# Patient Record
Sex: Female | Born: 1975 | Race: White | Hispanic: No | Marital: Married | Smoking: Never smoker
Health system: Southern US, Community
[De-identification: ages and names within clinical notes are randomized; demographics above are authoritative.]

## PROBLEM LIST (undated history)

## (undated) DIAGNOSIS — E162 Hypoglycemia, unspecified: Secondary | ICD-10-CM

## (undated) DIAGNOSIS — Z9889 Other specified postprocedural states: Secondary | ICD-10-CM

## (undated) DIAGNOSIS — R112 Nausea with vomiting, unspecified: Secondary | ICD-10-CM

## (undated) HISTORY — PX: LEFT OOPHORECTOMY: SHX1961

## (undated) HISTORY — PX: OVARIAN CYST REMOVAL: SHX89

## (undated) HISTORY — PX: DILATION AND CURETTAGE OF UTERUS: SHX78

## (undated) HISTORY — PX: KNEE ARTHROSCOPY: SUR90

## (undated) HISTORY — PX: CHOLECYSTECTOMY: SHX55

---

## 2002-05-03 ENCOUNTER — Other Ambulatory Visit: Admission: RE | Admit: 2002-05-03 | Discharge: 2002-05-03 | Payer: Self-pay | Admitting: Gynecology

## 2009-12-23 ENCOUNTER — Emergency Department (HOSPITAL_COMMUNITY): Admission: EM | Admit: 2009-12-23 | Discharge: 2009-12-23 | Payer: Self-pay | Admitting: Emergency Medicine

## 2010-01-30 ENCOUNTER — Ambulatory Visit: Payer: Self-pay | Admitting: Obstetrics & Gynecology

## 2010-01-30 ENCOUNTER — Other Ambulatory Visit: Admission: RE | Admit: 2010-01-30 | Discharge: 2010-01-30 | Payer: Self-pay | Admitting: Obstetrics & Gynecology

## 2010-02-20 ENCOUNTER — Ambulatory Visit: Payer: Self-pay | Admitting: Obstetrics & Gynecology

## 2010-03-06 ENCOUNTER — Ambulatory Visit: Payer: Self-pay | Admitting: Obstetrics & Gynecology

## 2010-07-02 ENCOUNTER — Emergency Department (HOSPITAL_COMMUNITY)
Admission: EM | Admit: 2010-07-02 | Discharge: 2010-07-02 | Payer: Self-pay | Source: Home / Self Care | Admitting: Emergency Medicine

## 2010-10-07 ENCOUNTER — Other Ambulatory Visit: Payer: Self-pay | Admitting: Physician Assistant

## 2010-10-07 ENCOUNTER — Encounter: Payer: Self-pay | Admitting: Physician Assistant

## 2010-10-07 DIAGNOSIS — Z3041 Encounter for surveillance of contraceptive pills: Secondary | ICD-10-CM

## 2010-10-07 LAB — POCT PREGNANCY, URINE: Preg Test, Ur: NEGATIVE

## 2010-10-08 NOTE — Group Therapy Note (Signed)
Vanessa Duffy, DUFORD NO.:  [id number]  MEDICAL RECORD NO.:  [id number]           PATIENT TYPE:  A  LOCATION:  [facility]                   FACILITY:  [facility]  PHYSICIAN:  Tinnie Gens, MD        DATE OF BIRTH:  Jan 20, 1976  DATE OF SERVICE:  10/07/2010                                 CLINIC NOTE  CHIEF COMPLAINT:  Need birth control refill.  HISTORY OF PRESENT ILLNESS:  The patient is a 35 year old gravida 4, para 3-0-1-3 who was previously a patient at the [facility], but she lost her health insurance.  She was scheduled for a LEEP procedure there and lost her insurance 2 weeks prior to the procedure being done, so she has not had that done and they refused to refill her birth control pills until she has that done.  She comes in today, still not having the insurance.  She also notes that she needs this LEEP to be done.  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  The patient understands these risks, she is going to be married sometime soon, but she does have financial application to fill out to get her LEEP done sooner.  PAST MEDICAL HISTORY:  Asthma and anemia.  PAST SURGICAL HISTORY:  Three knee surgeries, cholecystectomy, D and C, 3 surgeries to remove cyst, and 1 ovarian removal.  Left oophorectomy.  MEDICATION:  She is presently on Tri-Previfem one p.o. daily.  ALLERGIES:  ASPIRIN, CODEINE, MORPHINE, and CELEBREX.  OBSTETRICAL HISTORY:  G4, P3 with 3 vaginal deliveries.  GYN HISTORY:  Menarche at age 78.  Cycles are regular, every 29 days, last for approximately 5 days.  She uses pill for birth control.  She has a history of abnormal Pap in September and she needs a LEEP.  She has been told she has endometriosis, ovarian cyst, and Chlamydia in the past.  SOCIAL HISTORY:  She does work at [employer].  She is a Runner, broadcasting/film/video.  She lives with her fiance.  She does not smoke cigarettes. She does drink  alcohol socially 2 per week.  No other drug use.  FAMILY HISTORY:  Significant for diabetes, heart disease, hypertension, and a grandmother with colon cancer.  PHYSICAL EXAMINATION:  GENERAL/VITAL SIGNS:  On physical exam today, vitals signs are as noted in the chart.  She is a well-developed, well- nourished female in no acute stress. ABDOMEN:  Soft, nontender, and nondistended. GU:  Normal external female genitalia.  BUS is normal.  Cervix parous without lesions.  Uterus normal size, shape, anteverted, and mobile. Adnexa without tenderness.  IMPRESSION: 1. Birth control consult, needs a pill. 2. History of abnormal Pap.  Needs LEEP procedure and the patient with     no insurance.  PLAN:  We will refill her birth control for 3 months, which should go through to the end of July.  She has a big trip planned to [place] and she plans on having in June and she will be having her LEEP done after that.  She will follow her paper work and see  what she qualifies for in terms of assistance.  Again 10% risk of cancer was stressed with the patient regarding high-grade SIL with no treatment.  She will be almost a year without treatment.  The patient seems to understand all these risks, and she will come back for her LEEP.          ______________________________ Tinnie Gens, MD    TP/MEDQ  D:  10/07/2010  T:  10/08/2010  Job:  [id number]

## 2010-10-29 NOTE — Assessment & Plan Note (Signed)
[name], Duffy NO.:  [id number]   MEDICAL RECORD NO.:  [id number]          PATIENT TYPE:  POB   LOCATION:  [facility] [place]         FACILITY:  [facility]   PHYSICIAN:  Allie Bossier, MD        DATE OF BIRTH:  January 05, 1976   DATE OF SERVICE:  01/30/2010                                  CLINIC NOTE   [name] is a 35 year old divorced white gravida 4, para 3, abortus 1  who comes in here for a new patient annual exam.  She would like  permanent sterility in the form of a tubal ligation.  She also complains  this month, she has had some breakthrough bleeding even though she has  been an phonetical about taking her birth control pills.   PAST MEDICAL HISTORY:  Endometriosis.   PAST SURGICAL HISTORY:  She has had at least 5 laparoscopies including a  left oophorectomy due to recurrent ruptured ovarian cysts.  She had a  cholecystectomy, laparoscopic, and a D and C.   FAMILY HISTORY:  Negative for breast cancer, but a maternal grandmother  had what sounds like widespread cancer that did involve at some point or  another her colon and a GYN organ as well as her brain and other organs.   SOCIAL HISTORY:  She drinks alcohol socially, but denies tobacco or  illegal drug use.   REVIEW OF SYSTEMS:  She lives with her mother and stepfather, her sister  and a total of 4 children.  She is planning to move in with her  boyfriend in the near future.  Her Pap smear was last done 2 years ago  and is always abnormal, although she denies any cervical surgery.  Previously, she had a Mirena in approximately 2002 and have it removed  after it got infected and she has been monogamous for the last 6 weeks.   MEDICATIONS:  Tri-Previfem birth control pill.   ALLERGIES:  ASPIRIN, CODEINE, MORPHINE, and CELEBREX.  No latex  allergies.  No food allergies.   PHYSICAL EXAMINATION:  GENERAL:  Well-nourished pleasant white female.  VITAL SIGNS:  Height 5 feet 9.5 inches, weight 172  pounds, blood  pressure 113/71, pulse 75.  HEENT:  Normal.  HEART:  Regular rate and rhythm.  LUNGS:  Clear to auscultation bilaterally.  BREASTS:  Normal bilaterally.  ABDOMEN:  Benign.  EXTERNAL GENITALIA:  No lesions.  Cervix parous, no lesions.  Uterus  normal size, shape, and anteverted mobile.  Adnexa nontender.  No  masses.   ASSESSMENT AND PLAN:  1. Annual exam.  I have checked Pap smear.  I recommended self-breast      and self-vulvar exams.  2. Breakthrough bleeding.  I have checked the TSH and cervical      cultures.  3. She desires sterility.  She signed her Medicaid 38 papers today,      and I have turned in the paperwork to schedule an Essure.  At once      I counseled her about the risks of repeat of a yet another      laparoscopy and its risk of damage to the bowel.  She  has decided      to go forth with the Essure.  She understands that she will need to      continue her OCPs until a hysteroscopy shows that the Essure has      completely blocked the tube.  She may in fact stay on her OCPs for      an extended period of time because of her history of endometriosis      and dysmenorrhea.      Allie Bossier, MD     MCD/MEDQ  D:  01/30/2010  T:  01/31/2010  Job:  [id number]

## 2010-11-14 ENCOUNTER — Encounter: Payer: Self-pay | Admitting: Obstetrics and Gynecology

## 2011-02-27 ENCOUNTER — Ambulatory Visit (HOSPITAL_COMMUNITY)
Admission: RE | Admit: 2011-02-27 | Discharge: 2011-02-27 | Disposition: A | Discharge status: Home / Self Care | Payer: Self-pay | Source: Ambulatory Visit | Attending: Internal Medicine | Admitting: Internal Medicine

## 2011-02-27 ENCOUNTER — Other Ambulatory Visit (HOSPITAL_COMMUNITY): Payer: Self-pay | Admitting: Internal Medicine

## 2011-02-27 DIAGNOSIS — W19XXXA Unspecified fall, initial encounter: Secondary | ICD-10-CM

## 2011-02-27 DIAGNOSIS — R51 Headache: Secondary | ICD-10-CM | POA: Insufficient documentation

## 2011-04-14 ENCOUNTER — Emergency Department (HOSPITAL_COMMUNITY): Payer: Self-pay

## 2011-04-14 ENCOUNTER — Emergency Department (HOSPITAL_COMMUNITY)
Admission: EM | Admit: 2011-04-14 | Discharge: 2011-04-14 | Disposition: A | Discharge status: Home / Self Care | Payer: Self-pay | Attending: Emergency Medicine | Admitting: Emergency Medicine

## 2011-04-14 DIAGNOSIS — F411 Generalized anxiety disorder: Secondary | ICD-10-CM | POA: Insufficient documentation

## 2011-04-14 DIAGNOSIS — W261XXA Contact with sword or dagger, initial encounter: Secondary | ICD-10-CM | POA: Insufficient documentation

## 2011-04-14 DIAGNOSIS — S61409A Unspecified open wound of unspecified hand, initial encounter: Secondary | ICD-10-CM | POA: Insufficient documentation

## 2011-04-14 DIAGNOSIS — W260XXA Contact with knife, initial encounter: Secondary | ICD-10-CM | POA: Insufficient documentation

## 2011-04-23 ENCOUNTER — Telehealth: Payer: Self-pay | Admitting: *Deleted

## 2011-04-23 NOTE — Telephone Encounter (Signed)
Left message for pt we have received a refill request from [facility] [phone] for her birth control. We last called in a refill to [facility]- just wanted to confirm the correct pharmacy. Also, she needs an appt for LEEP procedure- need to know if she saw the LEEP video @ last appt in April. Please call back to verify correct pharmacy and to scheduled follow up appt- either LEEP eval or LEEP procedure if video already viewed. Dr. Shawnie Pons said pt may have enough refill of Tri-sprintec until next appt.

## 2011-04-28 MED ORDER — NORGESTIM-ETH ESTRAD TRIPHASIC 0.18/0.215/0.25 MG-25 MCG PO TABS: 1.0000 | ORAL_TABLET | Freq: Every day | ORAL | Status: DC

## 2011-04-28 NOTE — Telephone Encounter (Signed)
Spoke w/pt today- confirmed that she has changed pharmacies to [facility] refill sent in. I also discussed that she needs the LEEP procedure. She acknowledged the info-she states that she is getting married and will have insurance effective in January. I instructed pt to call back for LEEP eval appt in Jan. or early Feb.  Pt voiced understanding.

## 2011-05-06 ENCOUNTER — Telehealth: Payer: Self-pay | Admitting: *Deleted

## 2011-05-06 MED ORDER — NORGESTIM-ETH ESTRAD TRIPHASIC 0.18/0.215/0.25 MG-35 MCG PO TABS: 1.0000 | ORAL_TABLET | Freq: Every day | ORAL | Status: DC

## 2011-05-06 NOTE — Telephone Encounter (Addendum)
Message left by pharmacist that she needed clarification of an e-prescribed order.  I called back this morning and spoke with pharmacist- Thayer Ohm. He stated that the pt wanted the generic equivalent of ortho Tricyclen and what was ordered was the Ortho Tricyclen Lo for which there is no generic. I authorized the change to TriSprintec 1 pack and 3 refills.

## 2011-07-22 ENCOUNTER — Encounter: Payer: Self-pay | Admitting: Obstetrics & Gynecology

## 2011-07-22 ENCOUNTER — Ambulatory Visit (INDEPENDENT_AMBULATORY_CARE_PROVIDER_SITE_OTHER): Admitting: Obstetrics & Gynecology

## 2011-07-22 ENCOUNTER — Other Ambulatory Visit: Payer: Self-pay | Admitting: Obstetrics & Gynecology

## 2011-07-22 DIAGNOSIS — Z1272 Encounter for screening for malignant neoplasm of vagina: Secondary | ICD-10-CM

## 2011-07-22 DIAGNOSIS — Z0000 Encounter for general adult medical examination without abnormal findings: Secondary | ICD-10-CM

## 2011-07-22 DIAGNOSIS — Z113 Encounter for screening for infections with a predominantly sexual mode of transmission: Secondary | ICD-10-CM

## 2011-07-22 DIAGNOSIS — N938 Other specified abnormal uterine and vaginal bleeding: Secondary | ICD-10-CM

## 2011-07-22 DIAGNOSIS — N870 Mild cervical dysplasia: Secondary | ICD-10-CM

## 2011-07-22 DIAGNOSIS — N939 Abnormal uterine and vaginal bleeding, unspecified: Secondary | ICD-10-CM
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
## 2011-07-23 LAB — TSH: TSH: 0.979 u[IU]/mL (ref 0.350–4.500)

## 2011-07-24 ENCOUNTER — Ambulatory Visit (HOSPITAL_COMMUNITY)
Admission: RE | Admit: 2011-07-24 | Discharge: 2011-07-24 | Disposition: A | Discharge status: Home / Self Care | Source: Ambulatory Visit | Attending: Obstetrics & Gynecology | Admitting: Obstetrics & Gynecology

## 2011-07-24 DIAGNOSIS — N949 Unspecified condition associated with female genital organs and menstrual cycle: Secondary | ICD-10-CM | POA: Insufficient documentation

## 2011-07-24 DIAGNOSIS — N938 Other specified abnormal uterine and vaginal bleeding: Secondary | ICD-10-CM | POA: Insufficient documentation

## 2011-09-02 ENCOUNTER — Ambulatory Visit: Admit: 2011-09-02 | Payer: Self-pay | Admitting: Obstetrics & Gynecology

## 2011-09-02 SURGERY — LIGATION, FALLOPIAN TUBE, LAPAROSCOPIC
Anesthesia: General | Laterality: Bilateral

## 2011-09-17 ENCOUNTER — Encounter (HOSPITAL_COMMUNITY): Payer: Self-pay | Admitting: Pharmacist

## 2011-09-29 ENCOUNTER — Inpatient Hospital Stay (HOSPITAL_COMMUNITY): Admission: RE | Admit: 2011-09-29 | Source: Ambulatory Visit

## 2011-10-01 ENCOUNTER — Encounter (HOSPITAL_COMMUNITY): Payer: Self-pay

## 2011-10-01 ENCOUNTER — Encounter (HOSPITAL_COMMUNITY)
Admission: RE | Admit: 2011-10-01 | Discharge: 2011-10-01 | Disposition: A | Discharge status: Home / Self Care | Source: Ambulatory Visit | Attending: Obstetrics & Gynecology | Admitting: Obstetrics & Gynecology

## 2011-10-01 HISTORY — DX: Other specified postprocedural states: R11.2

## 2011-10-01 HISTORY — DX: Hypoglycemia, unspecified: E16.2

## 2011-10-01 HISTORY — DX: Other specified postprocedural states: Z98.890

## 2011-10-01 LAB — CBC
HCT: 36.9 % (ref 36.0–46.0)
RBC: 4.04 MIL/uL (ref 3.87–5.11)
RDW: 12.4 % (ref 11.5–15.5)
WBC: 5.6 10*3/uL (ref 4.0–10.5)

## 2011-10-01 LAB — SURGICAL PCR SCREEN: Staphylococcus aureus: POSITIVE — AB

## 2011-10-01 NOTE — Patient Instructions (Addendum)
20 ROSANNE W[address]LFAR[address]  10/01/2011   Your procedure is scheduled on:  10/06/11  Enter through the Main Entrance of [facility] at 3:00 PM  Pick up the phone at the desk and dial [phone].   Call this number if you have problems the morning of surgery: [phone]   Remember:   Do not eat food:After Midnight.  Do not drink clear liquids: 4 Hours before arrival.  Take these medicines the morning of surgery with A SIP OF WATER: NA   Do not wear jewelry, make-up or nail polish.  Do not wear lotions, powders, or perfumes. You may wear deodorant.  Do not shave 48 hours prior to surgery.  Do not bring valuables to the hospital.  Contacts, dentures or bridgework may not be worn into surgery.  Leave suitcase in the car. After surgery it may be brought to your room.  For patients admitted to the hospital, checkout time is 11:00 AM the day of discharge.   Patients discharged the day of surgery will not be allowed to drive home.  Name and phone number of your driver: NA  Special Instructions: CHG Shower Use Special Wash: 1/2 bottle night before surgery and 1/2 bottle morning of surgery.   Please read over the following fact sheets that you were given: MRSA Information

## 2011-10-06 ENCOUNTER — Encounter (HOSPITAL_COMMUNITY): Payer: Self-pay | Admitting: Anesthesiology

## 2011-10-06 ENCOUNTER — Ambulatory Visit (HOSPITAL_COMMUNITY): Admitting: Anesthesiology

## 2011-10-06 ENCOUNTER — Encounter (HOSPITAL_COMMUNITY): Payer: Self-pay | Admitting: *Deleted

## 2011-10-06 ENCOUNTER — Encounter (HOSPITAL_COMMUNITY)
Admission: RE | Disposition: A | Discharge status: Home / Self Care | Payer: Self-pay | Source: Ambulatory Visit | Attending: Obstetrics & Gynecology

## 2011-10-06 ENCOUNTER — Ambulatory Visit (HOSPITAL_COMMUNITY)
Admission: RE | Admit: 2011-10-06 | Discharge: 2011-10-06 | Disposition: A | Discharge status: Home / Self Care | Source: Ambulatory Visit | Attending: Obstetrics & Gynecology | Admitting: Obstetrics & Gynecology

## 2011-10-06 DIAGNOSIS — Z302 Encounter for sterilization: Secondary | ICD-10-CM | POA: Insufficient documentation

## 2011-10-06 DIAGNOSIS — N83209 Unspecified ovarian cyst, unspecified side: Secondary | ICD-10-CM | POA: Insufficient documentation

## 2011-10-06 HISTORY — PX: LAPAROSCOPIC TUBAL LIGATION: SHX1937

## 2011-10-06 SURGERY — LIGATION, FALLOPIAN TUBE, LAPAROSCOPIC
Anesthesia: General | Site: Abdomen | Laterality: Bilateral | Wound class: Clean

## 2011-10-06 MED ORDER — HYDROCODONE-ACETAMINOPHEN 10-500 MG PO TABS: 1.0000 | ORAL_TABLET | Freq: Four times a day (QID) | ORAL | Status: AC | PRN

## 2011-10-06 MED ORDER — PROPOFOL 10 MG/ML IV EMUL
INTRAVENOUS | Status: AC
  Filled 2011-10-06: qty 20

## 2011-10-06 MED ORDER — FENTANYL CITRATE 0.05 MG/ML IJ SOLN
INTRAMUSCULAR | Status: AC
  Filled 2011-10-06: qty 2

## 2011-10-06 MED ORDER — FENTANYL CITRATE 0.05 MG/ML IJ SOLN
INTRAMUSCULAR | Status: DC | PRN
  Administered 2011-10-06: 100 ug via INTRAVENOUS
  Administered 2011-10-06: 150 ug via INTRAVENOUS

## 2011-10-06 MED ORDER — BUPIVACAINE HCL (PF) 0.5 % IJ SOLN
INTRAMUSCULAR | Status: DC | PRN
  Administered 2011-10-06: 6 mL

## 2011-10-06 MED ORDER — SCOPOLAMINE 1 MG/3DAYS TD PT72
MEDICATED_PATCH | TRANSDERMAL | Status: AC
  Administered 2011-10-06: 1.5 mg via TRANSDERMAL
  Filled 2011-10-06: qty 1

## 2011-10-06 MED ORDER — DEXTROSE IN LACTATED RINGERS 5 % IV SOLN: INTRAVENOUS | Status: DC

## 2011-10-06 MED ORDER — ROCURONIUM BROMIDE 100 MG/10ML IV SOLN
INTRAVENOUS | Status: DC | PRN
  Administered 2011-10-06: 30 mg via INTRAVENOUS

## 2011-10-06 MED ORDER — DEXAMETHASONE SODIUM PHOSPHATE 10 MG/ML IJ SOLN
INTRAMUSCULAR | Status: AC
  Filled 2011-10-06: qty 1

## 2011-10-06 MED ORDER — CEFAZOLIN SODIUM 1-5 GM-% IV SOLN
INTRAVENOUS | Status: AC
  Filled 2011-10-06: qty 50

## 2011-10-06 MED ORDER — PROPOFOL 10 MG/ML IV EMUL
INTRAVENOUS | Status: DC | PRN
  Administered 2011-10-06: 200 mg via INTRAVENOUS

## 2011-10-06 MED ORDER — GLYCOPYRROLATE 0.2 MG/ML IJ SOLN
INTRAMUSCULAR | Status: DC | PRN
  Administered 2011-10-06: 0.4 mg via INTRAVENOUS

## 2011-10-06 MED ORDER — NEOSTIGMINE METHYLSULFATE 1 MG/ML IJ SOLN
INTRAMUSCULAR | Status: AC
  Filled 2011-10-06: qty 10

## 2011-10-06 MED ORDER — MEPERIDINE HCL 25 MG/ML IJ SOLN: 6.2500 mg | INTRAMUSCULAR | Status: DC | PRN

## 2011-10-06 MED ORDER — SCOPOLAMINE 1 MG/3DAYS TD PT72
1.0000 | MEDICATED_PATCH | Freq: Once | TRANSDERMAL | Status: DC
  Administered 2011-10-06: 1.5 mg via TRANSDERMAL

## 2011-10-06 MED ORDER — FENTANYL CITRATE 0.05 MG/ML IJ SOLN
25.0000 ug | INTRAMUSCULAR | Status: DC | PRN
  Administered 2011-10-06 (×2): 50 ug via INTRAVENOUS

## 2011-10-06 MED ORDER — LIDOCAINE HCL (CARDIAC) 20 MG/ML IV SOLN
INTRAVENOUS | Status: AC
  Filled 2011-10-06: qty 5

## 2011-10-06 MED ORDER — PROMETHAZINE HCL 25 MG/ML IJ SOLN
INTRAMUSCULAR | Status: AC
  Filled 2011-10-06: qty 1

## 2011-10-06 MED ORDER — MIDAZOLAM HCL 2 MG/2ML IJ SOLN
INTRAMUSCULAR | Status: AC
  Filled 2011-10-06: qty 2

## 2011-10-06 MED ORDER — HYDROCODONE-ACETAMINOPHEN 5-325 MG PO TABS
ORAL_TABLET | ORAL | Status: AC
  Filled 2011-10-06: qty 1

## 2011-10-06 MED ORDER — HYDROCODONE-ACETAMINOPHEN 5-325 MG PO TABS: 1.0000 | ORAL_TABLET | ORAL | Status: DC | PRN

## 2011-10-06 MED ORDER — LACTATED RINGERS IV SOLN
INTRAVENOUS | Status: DC
  Administered 2011-10-06: 16:00:00 via INTRAVENOUS

## 2011-10-06 MED ORDER — LIDOCAINE HCL (CARDIAC) 20 MG/ML IV SOLN
INTRAVENOUS | Status: DC | PRN
  Administered 2011-10-06: 50 mg via INTRAVENOUS

## 2011-10-06 MED ORDER — FENTANYL CITRATE 0.05 MG/ML IJ SOLN
INTRAMUSCULAR | Status: AC
  Filled 2011-10-06: qty 5

## 2011-10-06 MED ORDER — ROCURONIUM BROMIDE 50 MG/5ML IV SOLN
INTRAVENOUS | Status: AC
  Filled 2011-10-06: qty 1

## 2011-10-06 MED ORDER — PROMETHAZINE HCL 25 MG/ML IJ SOLN
6.2500 mg | INTRAMUSCULAR | Status: DC | PRN
  Administered 2011-10-06: 6.25 mg via INTRAVENOUS

## 2011-10-06 MED ORDER — ONDANSETRON HCL 4 MG/2ML IJ SOLN
INTRAMUSCULAR | Status: AC
  Filled 2011-10-06: qty 2

## 2011-10-06 MED ORDER — ONDANSETRON HCL 4 MG/2ML IJ SOLN
INTRAMUSCULAR | Status: DC | PRN
  Administered 2011-10-06: 4 mg via INTRAVENOUS

## 2011-10-06 MED ORDER — NEOSTIGMINE METHYLSULFATE 1 MG/ML IJ SOLN
INTRAMUSCULAR | Status: DC | PRN
  Administered 2011-10-06: 2.5 mg via INTRAVENOUS

## 2011-10-06 MED ORDER — DEXAMETHASONE SODIUM PHOSPHATE 4 MG/ML IJ SOLN
INTRAMUSCULAR | Status: DC | PRN
  Administered 2011-10-06: 10 mg via INTRAVENOUS

## 2011-10-06 MED ORDER — MIDAZOLAM HCL 5 MG/5ML IJ SOLN
INTRAMUSCULAR | Status: DC | PRN
  Administered 2011-10-06: 2 mg via INTRAVENOUS

## 2011-10-06 MED ORDER — CEFAZOLIN SODIUM 1-5 GM-% IV SOLN
1.0000 g | INTRAVENOUS | Status: AC
  Administered 2011-10-06: 1 g via INTRAVENOUS

## 2011-10-06 MED ORDER — BUPIVACAINE HCL (PF) 0.5 % IJ SOLN
INTRAMUSCULAR | Status: AC
  Filled 2011-10-06: qty 30

## 2011-10-06 SURGICAL SUPPLY — 20 items
CABLE HIGH FREQUENCY MONO STRZ (ELECTRODE) IMPLANT
CATH ROBINSON RED A/P 16FR (CATHETERS) ×2 IMPLANT
CHLORAPREP W/TINT 26ML (MISCELLANEOUS) ×2 IMPLANT
CLIP FILSHIE TUBAL LIGA STRL (Clip) ×2 IMPLANT
CLOTH BEACON ORANGE TIMEOUT ST (SAFETY) ×2 IMPLANT
DRSG COVADERM PLUS 2X2 (GAUZE/BANDAGES/DRESSINGS) ×2 IMPLANT
ELECT REM PT RETURN 9FT ADLT (ELECTROSURGICAL) ×2
ELECTRODE REM PT RTRN 9FT ADLT (ELECTROSURGICAL) ×1 IMPLANT
GLOVE BIO SURGEON STRL SZ 6.5 (GLOVE) ×4 IMPLANT
GOWN PREVENTION PLUS LG XLONG (DISPOSABLE) ×4 IMPLANT
NDL SAFETY ECLIPSE 18X1.5 (NEEDLE) ×1 IMPLANT
NEEDLE HYPO 18GX1.5 SHARP (NEEDLE) ×1
NEEDLE INSUFFLATION 14GA 120MM (NEEDLE) ×2 IMPLANT
PACK LAPAROSCOPY BASIN (CUSTOM PROCEDURE TRAY) ×2 IMPLANT
SUT VICRYL 0 UR6 27IN ABS (SUTURE) ×2 IMPLANT
SUT VICRYL 4-0 PS2 18IN ABS (SUTURE) ×2 IMPLANT
TOWEL OR 17X24 6PK STRL BLUE (TOWEL DISPOSABLE) ×4 IMPLANT
TROCAR XCEL NON-BLD 11X100MML (ENDOMECHANICALS) IMPLANT
TROCAR XCEL NON-BLD 5MMX100MML (ENDOMECHANICALS) IMPLANT
WATER STERILE IRR 1000ML POUR (IV SOLUTION) IMPLANT

## 2011-10-06 NOTE — Op Note (Signed)
10/06/2011  4:43 PM  PATIENT:  Vanessa Duffy  36 y.o. female  PRE-OPERATIVE DIAGNOSIS:  Desires sterilization  POST-OPERATIVE DIAGNOSIS:  Desires sterilization  PROCEDURE:  Procedure(s) (LRB): LAPAROSCOPIC TUBAL LIGATION DRAINAGE OF RIGHT OVARIAN CYST  SURGEON:  Surgeon(s) and Role:    * Allie Bossier, MD - Primary  PHYSICIAN ASSISTANT:   ASSISTANTS: none   ANESTHESIA:   general  EBL:  Total I/O In: -  Out: 50 [Urine:50]  BLOOD ADMINISTERED:none  DRAINS: none   LOCAL MEDICATIONS USED:  MARCAINE     SPECIMEN:  No Specimen  DISPOSITION OF SPECIMEN:  N/A  COUNTS:  YES  TOURNIQUET:  * No tourniquets in log *  DICTATION: .Dragon Dictation  PLAN OF CARE: Discharge to home after PACU  PATIENT DISPOSITION:  PACU - hemodynamically stable.   Delay start of Pharmacological VTE agent (>24hrs) due to surgical blood loss or risk of bleeding: not applicable  The risks, benefits, and alternatives of surgery were explained, understood, accepted. Consents were signed. All questions were answered. She was taken to the operating room and general anesthesia was applied without complication. She was placed in dorsal lithotomy position. Her abdomen and vagina were prepped and draped in usual sterile fashion a timeout procedure was done a bimanual exam revealed a normal size and shape, anteverted, mobile uterus and nonenlarged adnexa. Gloves were changed and attention was turned to the abdomen after a Hulka manipulator was placed. 0.5% Marcaine was injected at the umbilicus there site of her previous incision (were she asked me to make this incision). A transverse incision was made and a varies needle was placed intraperitoneally. Low-flow CO2 was used to insufflate the abdomen to approximately 4 L. After good pneumoperitoneum was established, a 12 mm operative scope was placed. Laparoscopy confirmed correct placement. She was placed in Trendelenburg position and the pelvis was inspected. There  was about 20 cc of clear fluid in the cul-de-sac. A right ovarian cyst was noted as well as complete absence of her left ovary and tube. I placed a Filshie clip across the entire her right oviduct in the isthmic region. I then used a laparoscopic needle to drain the right ovarian cyst. I then used the laparoscopic suction to remove the cul-de-sac fluid. The CO2 was allowed to escape from the abdomen after hemostasis was noted at the right ovarian cystotomy site. I closed the umbilical fascia with a 0 Vicryl figure-of-eight suture. I did a subcuticular closure with 4-0 Vicryl suture at the skin. I removed the tenaculum. She was extubated and taken to recovery room in stable condition. The instrument, sponge, and needle counts were correct.

## 2011-10-06 NOTE — Anesthesia Postprocedure Evaluation (Signed)
Anesthesia Post Note  Patient: Vanessa Duffy  Procedure(s) Performed: Procedure(s) (LRB): LAPAROSCOPIC TUBAL LIGATION (Bilateral)  Anesthesia type: General  Patient location: PACU  Post pain: Pain level controlled  Post assessment: Post-op Vital signs reviewed  Last Vitals:  Filed Vitals:   10/06/11 1800  BP: 113/68  Pulse: 53  Temp:   Resp: 16    Post vital signs: Reviewed  Level of consciousness: sedated  Complications: No apparent anesthesia complicationsfj

## 2011-10-06 NOTE — Anesthesia Preprocedure Evaluation (Signed)
Anesthesia Evaluation  Patient identified by MRN, date of birth, ID band Patient awake    Reviewed: Allergy & Precautions, H&P , NPO status , Patient's Chart, lab work & pertinent test results, reviewed documented beta blocker date and time   Airway Mallampati: I TM Distance: >3 FB Neck ROM: full    Dental No notable dental hx. (+) Teeth Intact   Pulmonary asthma ,    Pulmonary exam normal       Cardiovascular negative cardio ROS      Neuro/Psych negative neurological ROS  negative psych ROS   GI/Hepatic negative GI ROS, Neg liver ROS,   Endo/Other  negative endocrine ROS  Renal/GU negative Renal ROS  negative genitourinary   Musculoskeletal negative musculoskeletal ROS (+)   Abdominal Normal abdominal exam  (+)   Peds negative pediatric ROS (+)  Hematology negative hematology ROS (+)   Anesthesia Other Findings   Reproductive/Obstetrics negative OB ROS                           Anesthesia Physical Anesthesia Plan  ASA: II  Anesthesia Plan: General   Post-op Pain Management:    Induction: Intravenous  Airway Management Planned: Oral ETT  Additional Equipment:   Intra-op Plan:   Post-operative Plan: Extubation in OR  Informed Consent: I have reviewed the patients History and Physical, chart, labs and discussed the procedure including the risks, benefits and alternatives for the proposed anesthesia with the patient or authorized representative who has indicated his/her understanding and acceptance.   Dental Advisory Given  Plan Discussed with: CRNA and Surgeon  Anesthesia Plan Comments:         Anesthesia Quick Evaluation

## 2011-10-06 NOTE — Transfer of Care (Signed)
Immediate Anesthesia Transfer of Care Note  Patient: Vanessa Duffy  Procedure(s) Performed: Procedure(s) (LRB): LAPAROSCOPIC TUBAL LIGATION (Bilateral)  Patient Location: PACU  Anesthesia Type: General  Level of Consciousness: awake, alert  and oriented  Airway & Oxygen Therapy: Patient Spontanous Breathing and Patient connected to nasal cannula oxygen  Post-op Assessment: Report given to PACU RN and Post -op Vital signs reviewed and stable  Post vital signs: Reviewed and stable  Complications: No apparent anesthesia complications

## 2011-10-06 NOTE — Anesthesia Procedure Notes (Signed)
Procedure Name: Intubation Date/Time: 10/06/2011 4:15 PM Performed by: Shanon Payor Pre-anesthesia Checklist: Patient identified, Emergency Drugs available, Suction available, Timeout performed and Patient being monitored Patient Re-evaluated:Patient Re-evaluated prior to inductionOxygen Delivery Method: Circle system utilized Preoxygenation: Pre-oxygenation with 100% oxygen Intubation Type: IV induction and Cricoid Pressure applied Ventilation: Mask ventilation without difficulty Laryngoscope Size: Miller, 2, Mac and 3 Grade View: Grade IV Tube type: Oral Tube size: 7.0 mm Number of attempts: 3 Airway Equipment and Method: Stylet Placement Confirmation: positive ETCO2,  ETT inserted through vocal cords under direct vision,  CO2 detector and breath sounds checked- equal and bilateral Secured at: 22 cm Tube secured with: Tape Dental Injury: Teeth and Oropharynx as per pre-operative assessment  Difficulty Due To: Difficulty was unanticipated and Difficult Airway- due to anterior larynx

## 2011-10-06 NOTE — H&P (Signed)
    36 yo MW [id number] who is here today because she wants to have permanent sterility. She is currently on OCPs.  PMH- concusion  PSH- left ooperectomy, GB, knee surgery BL, laparoscopy times 2   All- asa, celebrex, codeine  SH-no tobacco, rare etoh  FH- no breast, gyn cancer, colon cancer in her GM  ROS- married for 4 months, recently unemployed  PE  [facility] NAD  Heart-rrr Lungs-ctab Abd-benign  A/P-multiparity, desires sterility. Risks and failure rate discussed.

## 2011-10-06 NOTE — Anesthesia Postprocedure Evaluation (Signed)
  Anesthesia Post-op Note  Patient: Vanessa Duffy  Procedure(s) Performed: Procedure(s) (LRB): LAPAROSCOPIC TUBAL LIGATION (Bilateral)  Patient Location: PACU  Anesthesia Type: General  Level of Consciousness: awake, alert  and oriented  Airway and Oxygen Therapy: Patient Spontanous Breathing and Patient connected to nasal cannula oxygen  Post-op Pain: none  Post-op Assessment: Post-op Vital signs reviewed and Patient's Cardiovascular Status Stable  Post-op Vital Signs: Reviewed and stable  Complications: No apparent anesthesia complications

## 2011-10-06 NOTE — Discharge Instructions (Signed)
Diagnostic Laparoscopy Laparoscopy is a surgical procedure. It is used to diagnose and treat diseases inside the belly(abdomen). It is usually a brief, common, and relatively simple procedure. The laparoscopeis a thin, lighted, pencil-sized instrument. It is like a telescope. It is inserted into your abdomen through a small cut (incision). Your caregiver can look at the organs inside your body through this instrument. He or she can see if there is anything abnormal. Laparoscopy can be done either in a hospital or outpatient clinic. You may be given a mild sedative to help you relax before the procedure. Once in the operating room, you will be given a drug to make you sleep (general anesthesia). Laparoscopy usually lasts less than 1 hour. After the procedure, you will be monitored in a recovery area until you are stable and doing well. Once you are home, it will take 2 to 3 days to fully recover. RISKS AND COMPLICATIONS  Laparoscopy has relatively few risks. Your caregiver will discuss the risks with you before the procedure. Some problems that can occur include:  Infection.   Bleeding.   Damage to other organs.   Anesthetic side effects.  PROCEDURE Once you receive anesthesia, your surgeon inflates the abdomen with a harmless gas (carbon dioxide). This makes the organs easier to see. The laparoscope is inserted into the abdomen through a small incision. This allows your surgeon to see into the abdomen. Other small instruments are also inserted into the abdomen through other small openings. Many surgeons attach a video camera to the laparoscope to enlarge the view. During a diagnostic laparoscopy, the surgeon may be looking for inflammation, infection, or cancer. Your surgeon may take tissue samples(biopsies). The samples are sent to a specialist in looking at cells and tissue samples (pathologist). The pathologist examines them under a microscope. Biopsies can help to diagnose or confirm a  disease. AFTER THE PROCEDURE   The gas is released from inside the abdomen.   The incisions are closed with stitches (sutures). Because these incisions are small (usually less than 1/2 inch), there is usually minimal discomfort after the procedure. There may be some mild discomfort in the throat. This is from the tube placed in the throat while you were sleeping. You may have some mild abdominal discomfort. There may also be discomfort from the instrument placement incisions in the abdomen.   The recovery time is shortened as long as there are no complications.   You will rest in a recovery room until stable and doing well. As long as there are no complications, you may be allowed to go home.  FINDING OUT THE RESULTS OF YOUR TEST Not all test results are available during your visit. If your test results are not back during the visit, make an appointment with your caregiver to find out the results. Do not assume everything is normal if you have not heard from your caregiver or the medical facility. It is important for you to follow up on all of your test results. HOME CARE INSTRUCTIONS   Take all medicines as directed.   Only take over-the-counter or prescription medicines for pain, discomfort, or fever as directed by your caregiver.   Resume daily activities as directed.   Showers are preferred over baths.   You may resume sexual activities in 1 week or as directed.   Do not drive while taking narcotics.  SEEK MEDICAL CARE IF:   There is increasing abdominal pain.   There is new pain in the shoulders (shoulder strap areas).     You feel lightheaded or faint.   You have the chills.   You or your child has an oral temperature above 102 F (38.9 C).   There is pus-like (purulent) drainage from any of the wounds.   You are unable to pass gas or have a bowel movement.   You feel sick to your stomach (nauseous) or throw up (vomit).  MAKE SURE YOU:   Understand these instructions.    Will watch your condition.   Will get help right away if you are not doing well or get worse.  Document Released: 09/08/2000 Document Revised: 05/22/2011 Document Reviewed: 06/02/2007 [employer] Patient Information 2012 [employer]. 

## 2011-10-07 ENCOUNTER — Encounter (HOSPITAL_COMMUNITY): Payer: Self-pay | Admitting: Obstetrics & Gynecology

## 2011-10-13 ENCOUNTER — Encounter (HOSPITAL_COMMUNITY): Payer: Self-pay | Admitting: *Deleted

## 2011-10-13 ENCOUNTER — Inpatient Hospital Stay (HOSPITAL_COMMUNITY)
Admission: AD | Admit: 2011-10-13 | Discharge: 2011-10-13 | Disposition: A | Discharge status: Home / Self Care | Source: Ambulatory Visit | Attending: Obstetrics & Gynecology | Admitting: Obstetrics & Gynecology

## 2011-10-13 DIAGNOSIS — R109 Unspecified abdominal pain: Secondary | ICD-10-CM | POA: Insufficient documentation

## 2011-10-13 DIAGNOSIS — N938 Other specified abnormal uterine and vaginal bleeding: Secondary | ICD-10-CM | POA: Insufficient documentation

## 2011-10-13 DIAGNOSIS — N949 Unspecified condition associated with female genital organs and menstrual cycle: Secondary | ICD-10-CM | POA: Insufficient documentation

## 2011-10-13 LAB — CBC
Platelets: 211 10*3/uL (ref 150–400)
RBC: 4.45 MIL/uL (ref 3.87–5.11)
WBC: 5.4 10*3/uL (ref 4.0–10.5)

## 2011-10-13 MED ORDER — NAPROXEN 500 MG PO TABS: 500.0000 mg | ORAL_TABLET | Freq: Two times a day (BID) | ORAL | Status: AC

## 2011-10-13 NOTE — MAU Note (Signed)
Pt post op from last Monday, had cyst drained and tubal (one tube) by Dr Marice Potter.  Reports bleeding started on Thursday, progressively getting heavier, now changing pad q2 hours.  Reports lower right quadrant abdominal stabbing pains since yesterday.  Reports abdominal swelling that gets better at night.

## 2011-10-13 NOTE — MAU Provider Note (Signed)
History     CSN: [id number]  Arrival date and time: 10/13/11 1207   First Provider Initiated Contact with Patient 10/13/11 1453      Chief Complaint  Patient presents with  . Vaginal Bleeding   HPI  Pt is 1wk post op for a single tubal ligation on the right, complaining of increased vaginal bleeding. Pt reports stopping OCP yesterday.  Bleeding started on Thursday night. Averaging 10 tampons in a 24hr period since Thu. Also complaining of feelings of abdominal swelling that are better w/ rest. Crampy abdominal pain that feels like "period" pain but worse. Some improvement in surgical pain w/ Lortab. Occasional sharp stabbing pain in her lower abdomen that goes away instantly w/o associated alleviating or aggravating factors.   OB History    Grav Para Term Preterm Abortions TAB SAB Ect Mult Living   4 3 3  0 1 0 1 0 0 3      Past Medical History  Diagnosis Date  . Concussion 9/12    history  . PONV (postoperative nausea and vomiting)     also "very aggressive" when waking post op  . Asthma   . Hypoglycemia     Past Surgical History  Procedure Date  . Left oophorectomy   . Cholecystectomy   . Knee arthroscopy left x 2, right x 1  . Dilation and curettage of uterus   . Laparoscopic tubal ligation 10/06/2011    Procedure: LAPAROSCOPIC TUBAL LIGATION;  Surgeon: Allie Bossier, MD;  Location: [facility];  Service: Gynecology;  Laterality: Bilateral;  Right Laparscopic Tubal Ligation, Drainage Right Ovarian Cyst  . Ovarian cyst removal     Family History  Problem Relation Age of Onset  . Heart disease Mother   . Heart disease Father   . Heart disease Maternal Grandmother   . Diabetes Maternal Grandmother   . Stroke Maternal Grandmother   . Heart disease Maternal Grandfather   . Diabetes Maternal Grandfather   . Stroke Maternal Grandfather   . Heart disease Paternal Grandmother   . Heart disease Paternal Grandfather   . Anesthesia problems Neg Hx     History  Substance Use  Topics  . Smoking status: Never Smoker   . Smokeless tobacco: Never Used  . Alcohol Use: Yes     socailly    Allergies:  Allergies  Allergen Reactions  . Aspirin     Family hx of severe allergy- no personal hx of allergy  . Celebrex (Celecoxib)     fever  . Codeine     hyperactivity  . Morphine And Related     Can't urinate    Prescriptions prior to admission  Medication Sig Dispense Refill  . albuterol (PROVENTIL HFA) 108 (90 BASE) MCG/ACT inhaler Inhale 2 puffs into the lungs every 6 (six) hours as needed. asthma      . HYDROcodone-acetaminophen (LORTAB) 10-500 MG per tablet Take 1 tablet by mouth every 6 (six) hours as needed for pain.  30 tablet  0  . ibuprofen (ADVIL,MOTRIN) 200 MG tablet Take 800 mg by mouth every 6 (six) hours as needed. For headache.      . Loratadine 10 MG CAPS Take 1 capsule by mouth at bedtime. allergies        Review of Systems  Constitutional: Negative for fever.  Eyes: Negative for blurred vision and double vision.  Gastrointestinal: Positive for abdominal pain. Negative for nausea, vomiting, diarrhea and constipation.  Neurological: Negative for dizziness, sensory change and headaches.  Denies syncope   Physical Exam   Blood pressure 116/74, pulse 68, temperature 98.3 F (36.8 C), temperature source Oral, resp. rate 16, height 5' 9.5" (1.765 m), weight 68.13 kg (150 lb 3.2 oz), last menstrual period 10/09/2011, SpO2 100.00%.  Physical Exam  Constitutional: She is oriented to person, place, and time. She appears well-developed and well-nourished.  Eyes: EOM are normal.  Neck: Normal range of motion.  Cardiovascular: Normal rate and regular rhythm.        2+ distal pulses  Respiratory: Effort normal.  GI: Soft. She exhibits no distension and no mass. There is no rebound and no guarding.       Mild tenderness to deep palpation over RLQ  Musculoskeletal: Normal range of motion.  Neurological: She is alert and oriented to person,  place, and time.  Skin: Skin is warm and dry.  Psychiatric: She has a normal mood and affect. Her behavior is normal. Judgment and thought content normal.   CBC:    Component Value Date/Time   WBC 5.4 10/13/2011 1521   HGB 13.5 10/13/2011 1521   HCT 41.0 10/13/2011 1521   PLT 211 10/13/2011 1521   MCV 92.1 10/13/2011 1521    MAU Course  Procedures  CBC Orthostatic BPs  Assessment and Plan  35yo s/p Laproscopic R Tubal ligation and drainage of R ovarian cyst w/ vaginal bleeding and abdominal pain.  Vaginal bleedingand abdominal cramping likely due to pt stopping her OCP and starting her period. Hgb increased today from her preop Hgb one week ago. Orthostatics normal. Recommend pt take ibuprofen for relief of pain and bleeding. Reasons for return discussed.    MERRELL, DAVID 10/13/2011, 3:12 PM   Patient seen and examined.  Agree with above note.  Candelaria Celeste JEHIEL 10/13/2011 4:40 PM

## 2011-10-13 NOTE — MAU Note (Signed)
Patient states she had surgery on 4-22 to tie her only tube and remove an ovarian cyst. Surgery was done by Dr. Marice Potter. On 4-25 she states she started bleeding and has had abdominal bloating that goes down at night. Has been changing pads every 2 hours. Has had some abdominal cramping.

## 2011-10-13 NOTE — Discharge Instructions (Signed)
Please start taking the Naprosyn twice daily for your pain and bleeding. Your symptoms are likely due to your body healing from the surgery and from starting your period.  If your symptoms have not improved or worsen within the next week please call Dr. Ellin Saba office for additional information and evaluation or come back to the hospital. Have a great day.

## 2011-10-14 ENCOUNTER — Ambulatory Visit: Admitting: Obstetrics & Gynecology

## 2012-11-27 IMAGING — CR DG HAND COMPLETE 3+V*L*
3 series · 3 of 3 positions shown · non-contrast
Comparison: None.

CLINICAL DATA: Laceration to the palm of the hand

LEFT HAND - COMPLETE 3+ VIEW

[x hand pa left]
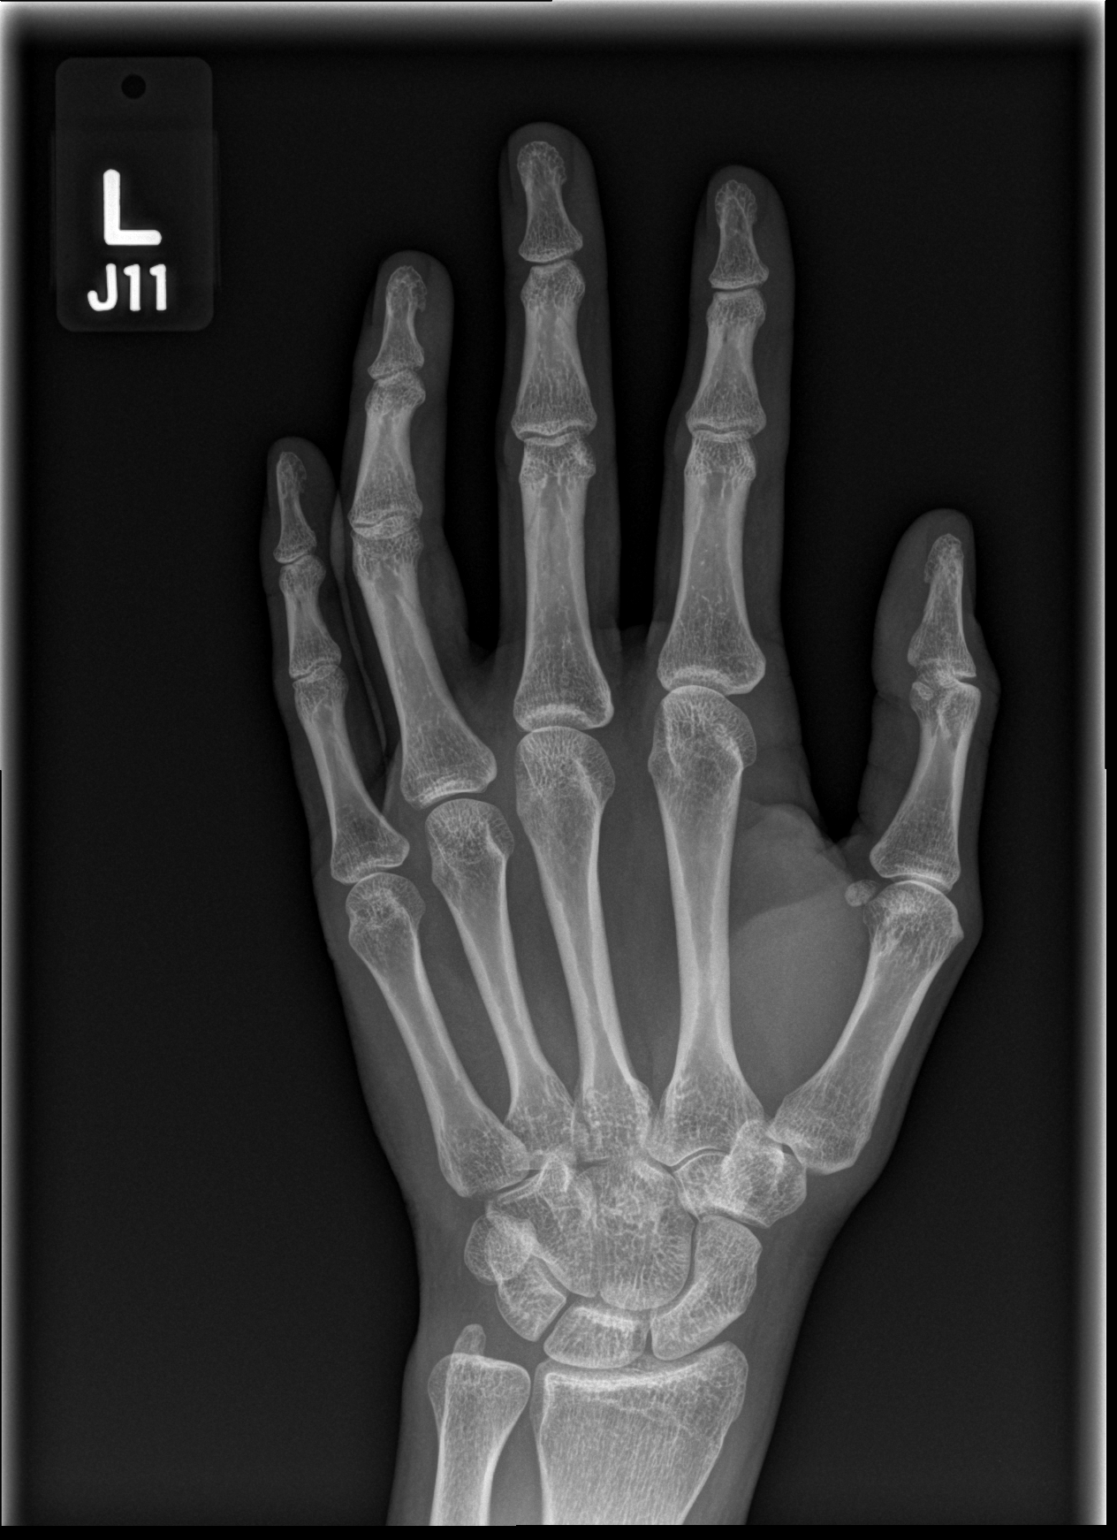

[x hand lat left (1 of 2)]
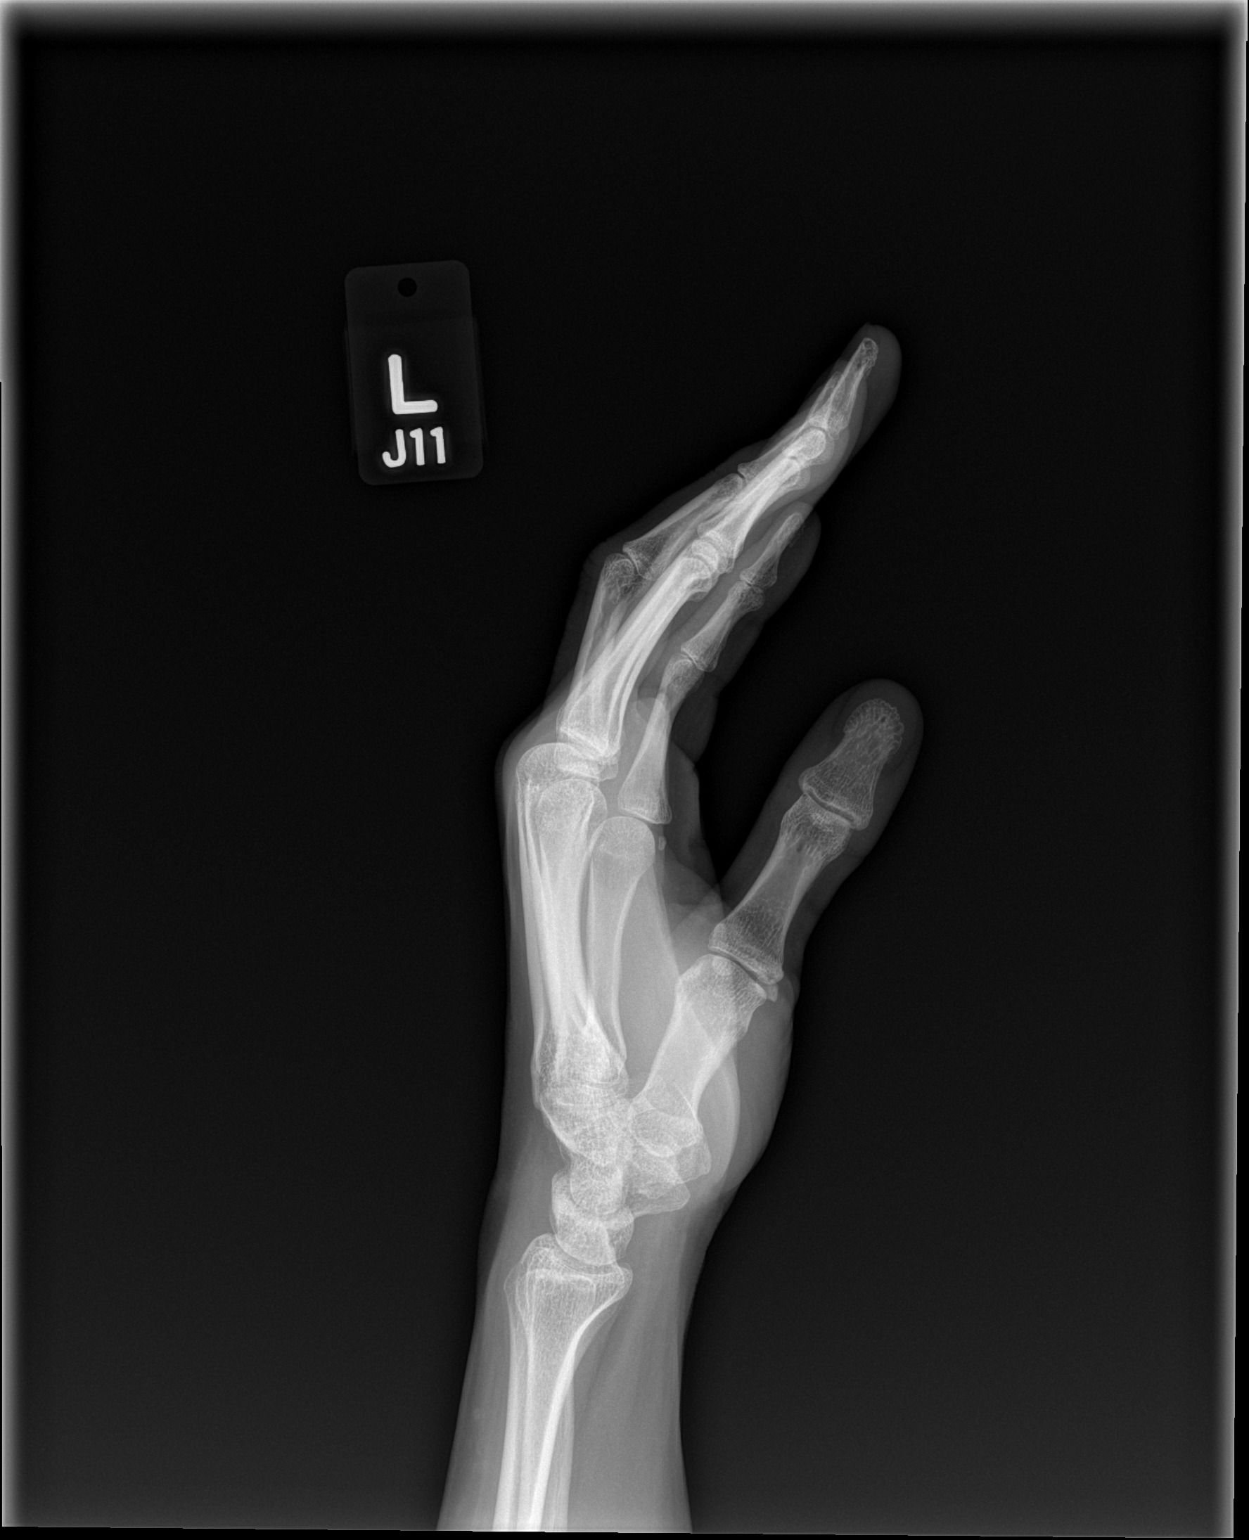

[x hand lat left (2 of 2)]
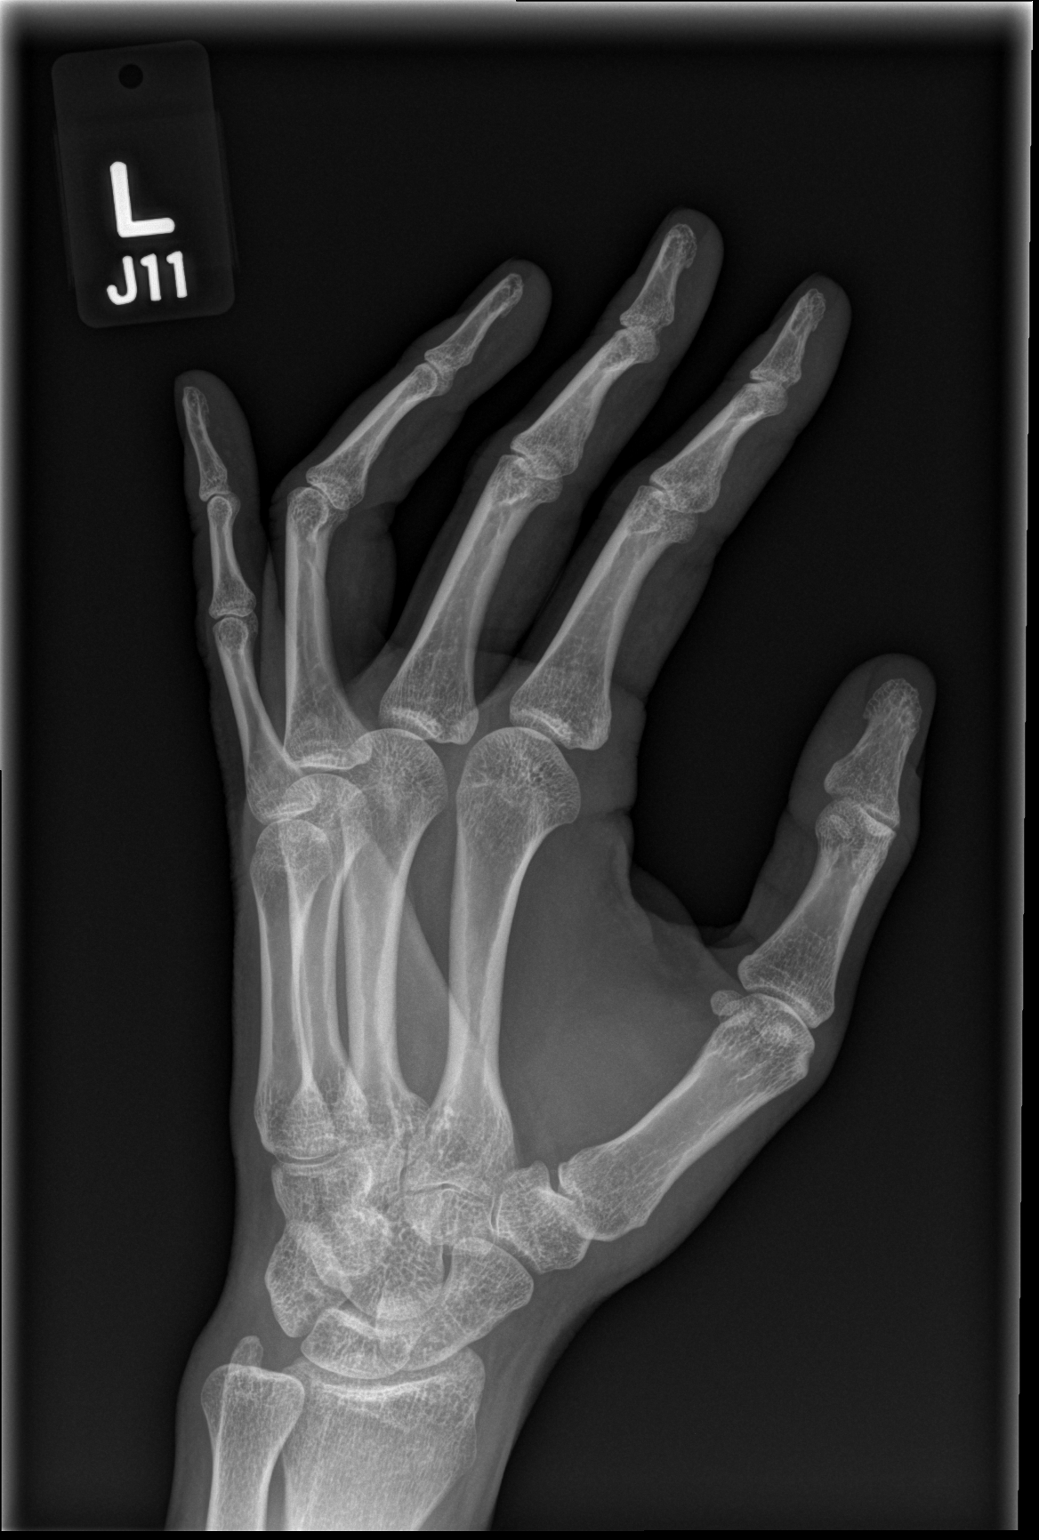

[3 of 3 positions shown; findings below may reference images not displayed]

FINDINGS: No acute fracture is seen.  Alignment is normal.  No
opaque foreign body is noted.  Joint spaces appear normal.
IMPRESSION: No bony abnormality.

## 2013-03-08 IMAGING — US US PELVIS COMPLETE
1 series · 14 of 25 positions shown · non-contrast
Comparison: None.

CLINICAL DATA: Dysfunctional uterine bleeding for 2 weeks every
month.  Post left oophorectomy.  LMP 07/21/2011

TRANSABDOMINAL AND TRANSVAGINAL ULTRASOUND OF PELVIS
TECHNIQUE: Both transabdominal and transvaginal ultrasound
examinations of the pelvis were performed. Transabdominal technique
was performed for global imaging of the pelvis including uterus,
ovaries, adnexal regions, and pelvic cul-de-sac.

[Series 1: us pelvis complete · 14 of 58 slices shown]
[im 1/58]
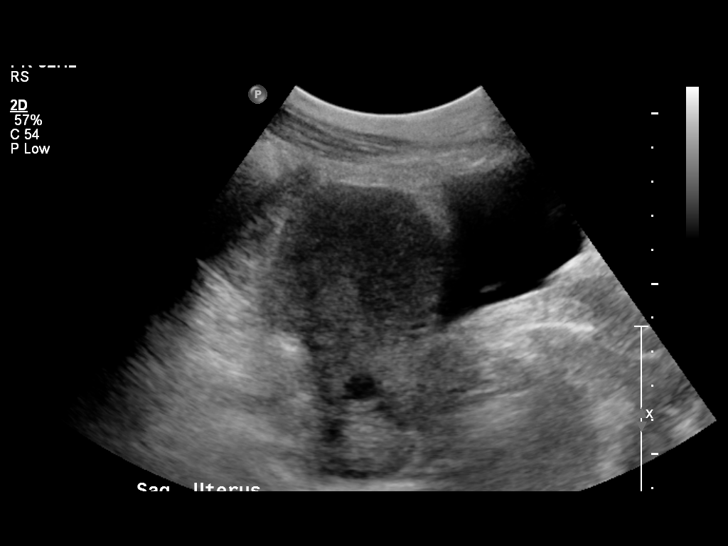
[im 5/58]
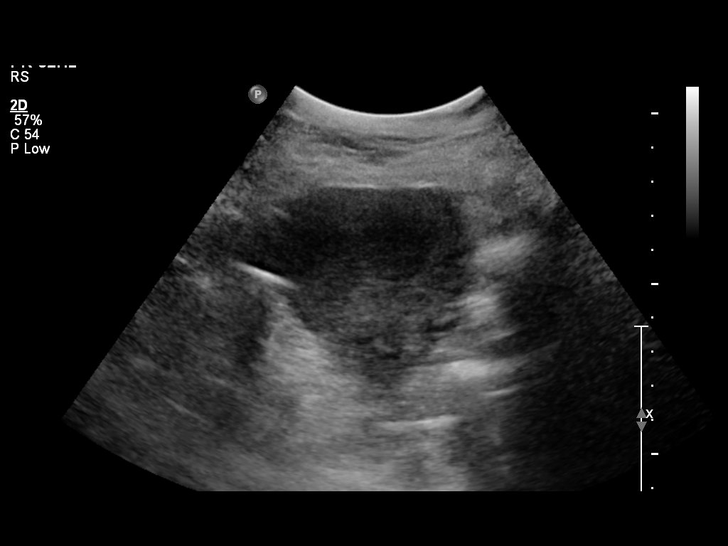
[im 10/58]
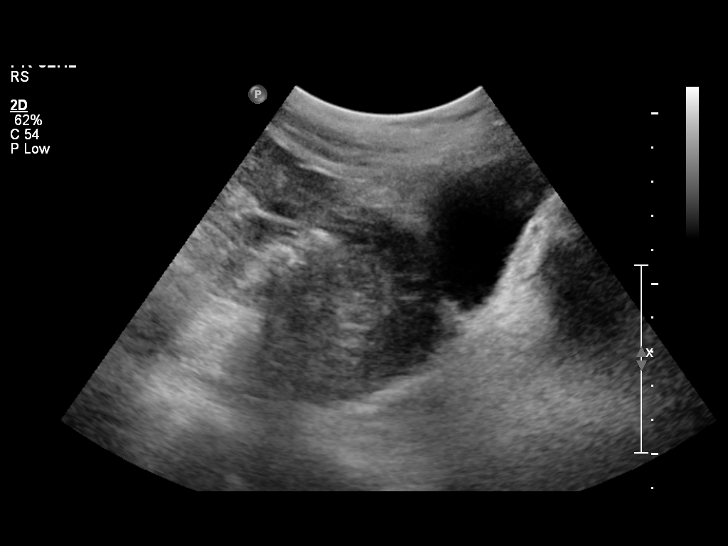
[im 15/58]
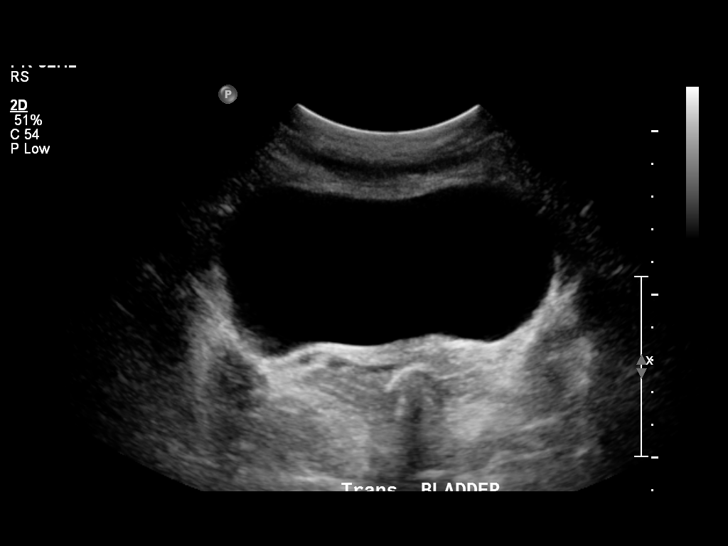
[im 20/58]
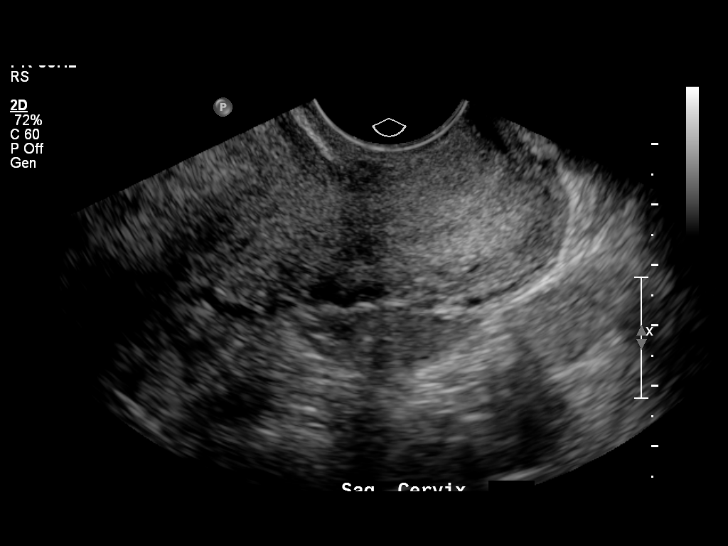
[im 22/58]
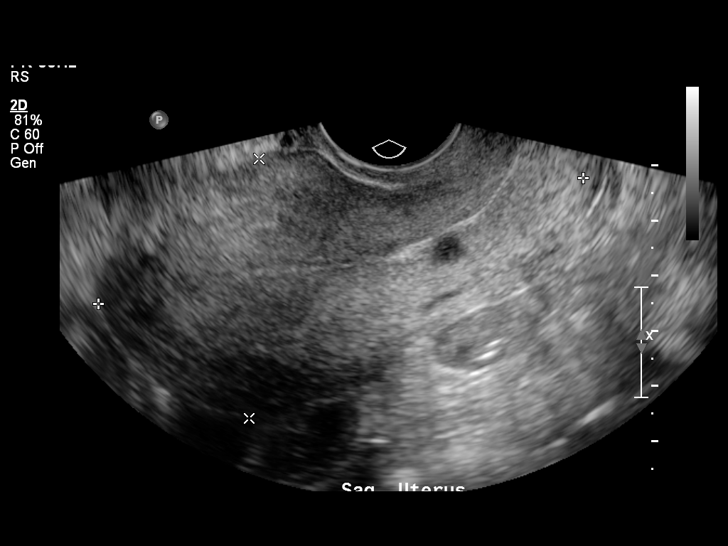
[im 27/58]
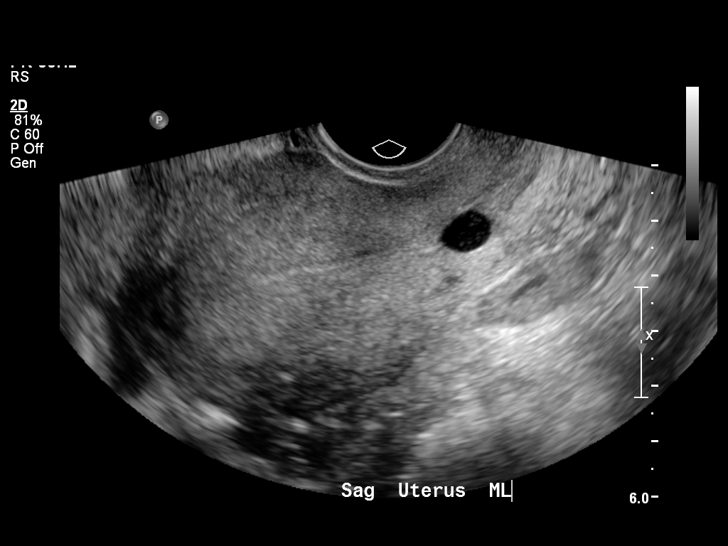
[im 31/58]
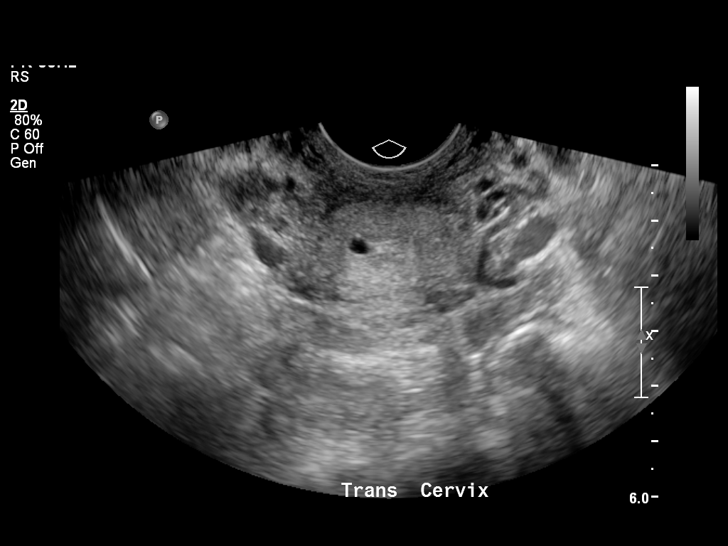
[im 36/58]
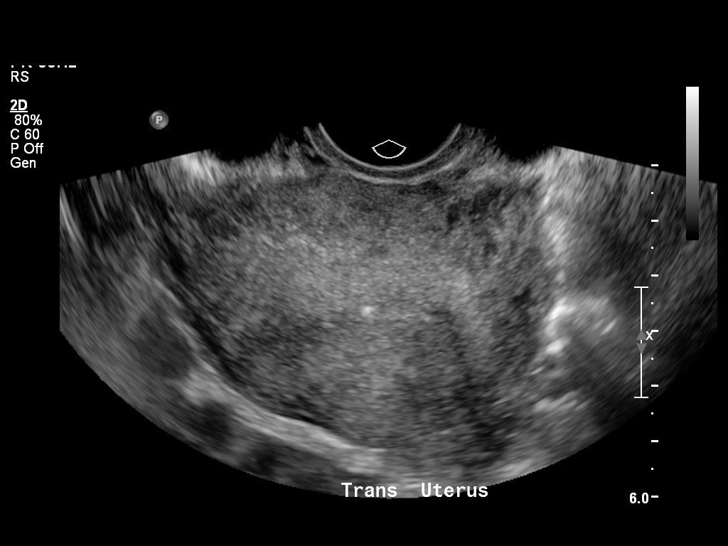
[im 39/58]
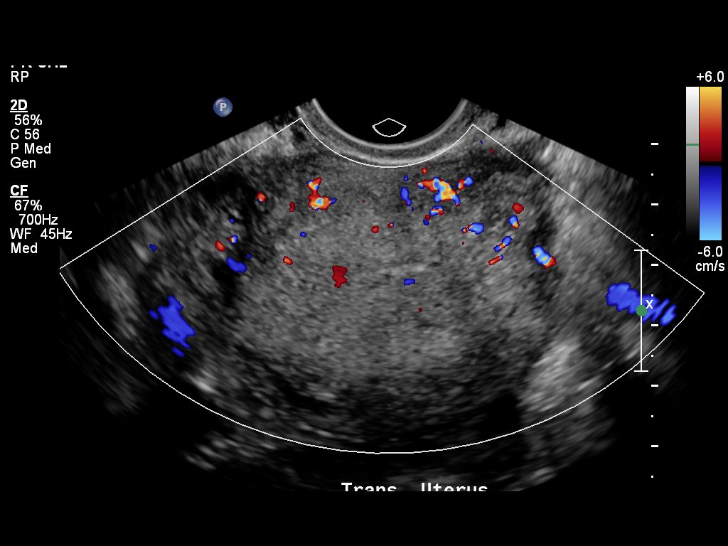
[im 43/58]
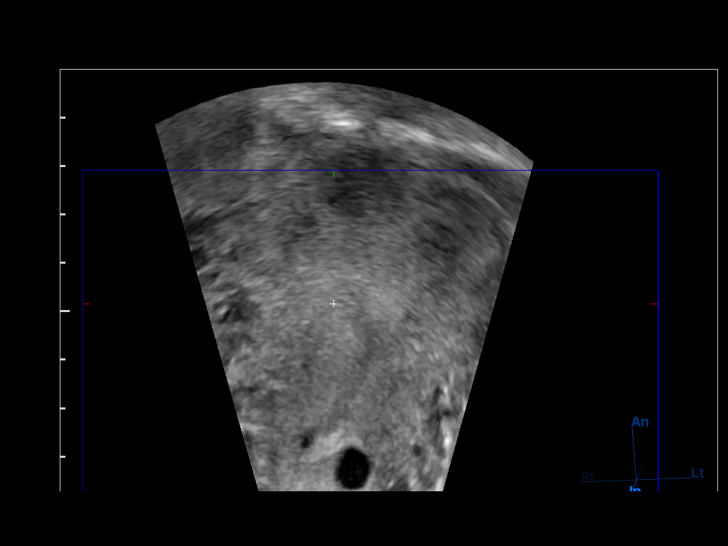
[im 48/58]
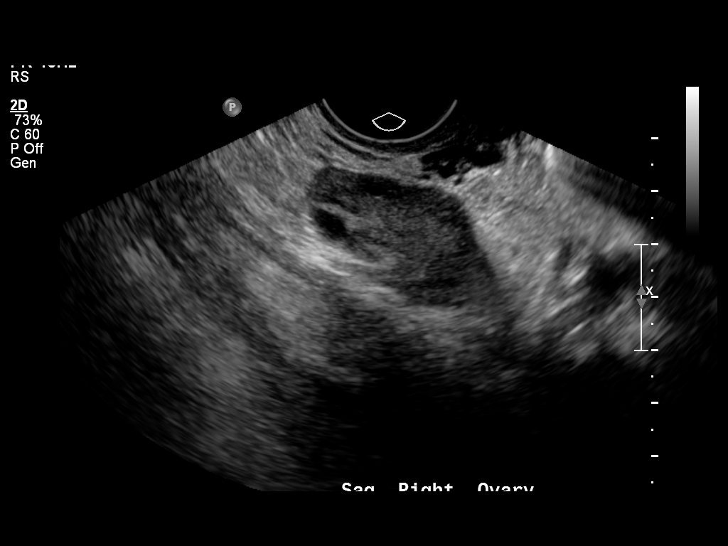
[im 53/58]
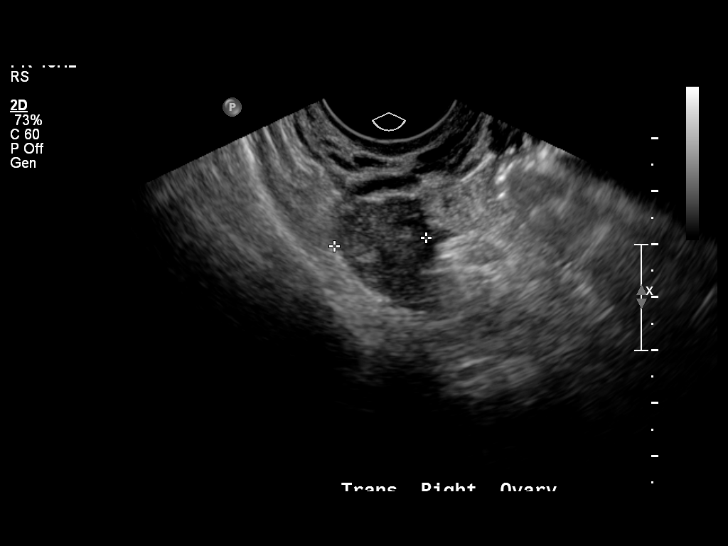
[im 58/58]
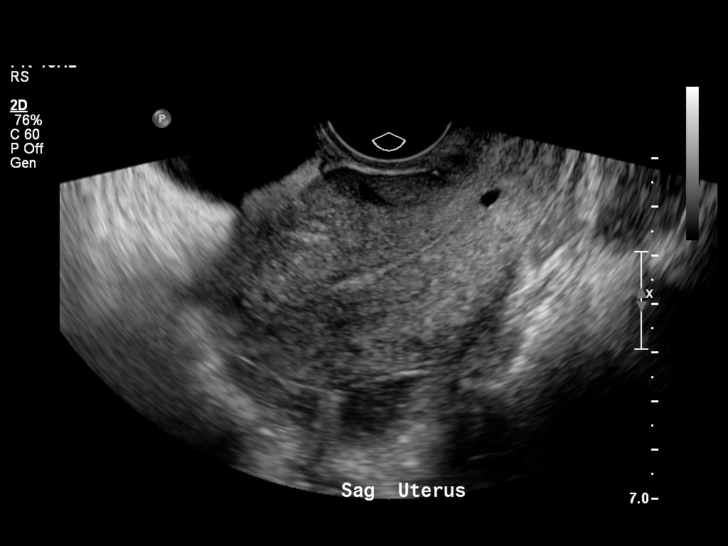

[14 of 25 positions shown; findings below may reference images not displayed]

It was necessary to proceed with endovaginal exam following the
transabdominal exam to visualize the myometrium, endometrium and
adnexa.
FINDINGS: Uterus: demonstrates a sagittal length of 9.1 cm, depth of 4.7 cm
in width of 5.8 cm.  A homogeneous myometrium is seen.

Endometrium: Appears thin and echogenic with an AP width of 2 mm.
No areas of focal thickening or heterogeneity are seen and this
would correlate with a postsecretory endometrial stripe and
correspond with the given LMP of 07/21/2011.e

Right ovary:  Has a normal appearance measuring 3.5 x 1.7 x 1.7 cm.

Left ovary: Has been surgically removed.

Other findings: Pelvic fluid or separate adnexal masses are seen.
IMPRESSION: Normal postsecretory pelvic ultrasound..

## 2013-07-30 ENCOUNTER — Emergency Department (HOSPITAL_COMMUNITY)
Admission: EM | Admit: 2013-07-30 | Discharge: 2013-07-31 | Disposition: A | Discharge status: Home / Self Care | Attending: Emergency Medicine | Admitting: Emergency Medicine

## 2013-07-30 ENCOUNTER — Emergency Department (HOSPITAL_COMMUNITY)

## 2013-07-30 ENCOUNTER — Encounter (HOSPITAL_COMMUNITY): Payer: Self-pay | Admitting: Emergency Medicine

## 2013-07-30 DIAGNOSIS — Z3202 Encounter for pregnancy test, result negative: Secondary | ICD-10-CM | POA: Insufficient documentation

## 2013-07-30 DIAGNOSIS — R34 Anuria and oliguria: Secondary | ICD-10-CM | POA: Insufficient documentation

## 2013-07-30 DIAGNOSIS — J45909 Unspecified asthma, uncomplicated: Secondary | ICD-10-CM | POA: Insufficient documentation

## 2013-07-30 DIAGNOSIS — Z9089 Acquired absence of other organs: Secondary | ICD-10-CM | POA: Insufficient documentation

## 2013-07-30 DIAGNOSIS — Z9851 Tubal ligation status: Secondary | ICD-10-CM | POA: Insufficient documentation

## 2013-07-30 DIAGNOSIS — N920 Excessive and frequent menstruation with regular cycle: Secondary | ICD-10-CM

## 2013-07-30 DIAGNOSIS — Z9079 Acquired absence of other genital organ(s): Secondary | ICD-10-CM | POA: Insufficient documentation

## 2013-07-30 DIAGNOSIS — Z9889 Other specified postprocedural states: Secondary | ICD-10-CM | POA: Insufficient documentation

## 2013-07-30 DIAGNOSIS — Z79899 Other long term (current) drug therapy: Secondary | ICD-10-CM | POA: Insufficient documentation

## 2013-07-30 LAB — URINALYSIS, ROUTINE W REFLEX MICROSCOPIC
Bilirubin Urine: NEGATIVE
Glucose, UA: NEGATIVE mg/dL
Hgb urine dipstick: NEGATIVE
Ketones, ur: NEGATIVE mg/dL
LEUKOCYTES UA: NEGATIVE
Nitrite: NEGATIVE
PROTEIN: NEGATIVE mg/dL
SPECIFIC GRAVITY, URINE: 1.025 (ref 1.005–1.030)
UROBILINOGEN UA: 1 mg/dL (ref 0.0–1.0)
pH: 8 (ref 5.0–8.0)

## 2013-07-30 LAB — CBC
HCT: 37.8 % (ref 36.0–46.0)
HEMOGLOBIN: 13.5 g/dL (ref 12.0–15.0)
MCH: 31.8 pg (ref 26.0–34.0)
MCHC: 35.7 g/dL (ref 30.0–36.0)
MCV: 89.2 fL (ref 78.0–100.0)
Platelets: 214 10*3/uL (ref 150–400)
RBC: 4.24 MIL/uL (ref 3.87–5.11)
RDW: 12.6 % (ref 11.5–15.5)
WBC: 6.1 10*3/uL (ref 4.0–10.5)

## 2013-07-30 LAB — POCT PREGNANCY, URINE: PREG TEST UR: NEGATIVE

## 2013-07-30 MED ORDER — FENTANYL CITRATE 0.05 MG/ML IJ SOLN
50.0000 ug | Freq: Once | INTRAMUSCULAR | Status: AC
  Administered 2013-07-30: 50 ug via INTRAVENOUS
  Filled 2013-07-30: qty 2

## 2013-07-30 MED ORDER — ONDANSETRON HCL 4 MG/2ML IJ SOLN
4.0000 mg | Freq: Once | INTRAMUSCULAR | Status: AC
  Administered 2013-07-30: 4 mg via INTRAVENOUS
  Filled 2013-07-30: qty 2

## 2013-07-30 MED ORDER — SODIUM CHLORIDE 0.9 % IV BOLUS (SEPSIS)
1000.0000 mL | Freq: Once | INTRAVENOUS | Status: AC
  Administered 2013-07-30: 1000 mL via INTRAVENOUS

## 2013-07-30 NOTE — ED Notes (Addendum)
Started passing blood clots today around 1400. Spotting started last saturday for 3-4d, heavier Thursday, worse today. 5-6 baseball sized clots, 6-7 smaller golf ball sized with lots of heavy bleeding, changing tampons and pads ~< Q1 hr. Also reports nausea, tired, cramping. (Denies: dizziness, weakness, sob, feeling cold or other sx), alert, NAD, calm, interactive.

## 2013-07-30 NOTE — ED Notes (Addendum)
Manus RuddSchulz, NP at bedside doing pelvic exam.

## 2013-07-30 NOTE — ED Provider Notes (Signed)
CSN: [id number]     Arrival date & time 07/30/13  2053 History   First MD Initiated Contact with Patient 07/30/13 2159     Chief Complaint  Patient presents with  . Vaginal Bleeding  . Abdominal Cramping  . Back Pain     (Consider location/radiation/quality/duration/timing/severity/associated sxs/prior Treatment) HPI Comments: Patient with complicated GYN Hx--L ovary removed  R ovarian cyst, Tubal ligation, endometriosis, abnormal vaginal bleeding Now with heavy bleeding, passing clots, RLQ pain .  Normal cycle started 6 days ago on day 4 bleeding intensified, heavier on day 5 yesterday and today passing large clots, changing pads and tampons hourly.   Patient is a 38 y.o. female presenting with vaginal bleeding, cramps, and back pain. The history is provided by the patient.  Vaginal Bleeding Quality:  Bright red, heavier than menses and clots Severity:  Severe Onset quality:  Gradual Timing:  Constant Progression:  Worsening Chronicity:  New Menstrual history:  Regular Possible pregnancy: no   Context: spontaneously   Relieved by:  Nothing Worsened by:  Activity Ineffective treatments:  None tried Associated symptoms: abdominal pain and back pain   Associated symptoms: no dysuria, no fever, no nausea and no vaginal discharge   Abdominal pain:    Quality:  Cramping   Severity:  Moderate   Timing:  Intermittent   Progression:  Worsening Risk factors: hx of endometriosis, gynecological surgery and ovarian cysts   Abdominal Cramping This is a new problem. The problem occurs intermittently. The problem has been gradually worsening. Associated symptoms include abdominal pain. Pertinent negatives include no chest pain, fever, nausea or vomiting. The symptoms are aggravated by exertion. She has tried nothing for the symptoms. The treatment provided no relief.  Back Pain Associated symptoms: abdominal pain   Associated symptoms: no chest pain, no dysuria and no fever     Past  Medical History  Diagnosis Date  . Concussion 9/12    history  . PONV (postoperative nausea and vomiting)     also "very aggressive" when waking post op  . Asthma   . Hypoglycemia    Past Surgical History  Procedure Laterality Date  . Left oophorectomy    . Cholecystectomy    . Knee arthroscopy  left x 2, right x 1  . Dilation and curettage of uterus    . Laparoscopic tubal ligation  10/06/2011    Procedure: LAPAROSCOPIC TUBAL LIGATION;  Surgeon: Allie Bossier, MD;  Location: [facility];  Service: Gynecology;  Laterality: Bilateral;  Right Laparscopic Tubal Ligation, Drainage Right Ovarian Cyst  . Ovarian cyst removal     Family History  Problem Relation Age of Onset  . Heart disease Mother   . Heart disease Father   . Heart disease Maternal Grandmother   . Diabetes Maternal Grandmother   . Stroke Maternal Grandmother   . Heart disease Maternal Grandfather   . Diabetes Maternal Grandfather   . Stroke Maternal Grandfather   . Heart disease Paternal Grandmother   . Heart disease Paternal Grandfather   . Anesthesia problems Neg Hx    History  Substance Use Topics  . Smoking status: Never Smoker   . Smokeless tobacco: Never Used  . Alcohol Use: Yes     Comment: socailly   OB History   Grav Para Term Preterm Abortions TAB SAB Ect Mult Living   4 3 3  0 1 0 1 0 0 3     Review of Systems  Constitutional: Negative for fever.  Cardiovascular: Negative for  chest pain.  Gastrointestinal: Positive for abdominal pain. Negative for nausea and vomiting.  Genitourinary: Positive for decreased urine volume and vaginal bleeding. Negative for dysuria and vaginal discharge.  Musculoskeletal: Positive for back pain.  Skin: Negative for pallor.  All other systems reviewed and are negative.      Allergies  Aspirin; Celebrex; Codeine; and Morphine and related  Home Medications   Current Outpatient Rx  Name  Route  Sig  Dispense  Refill  . Ascorbic Acid (VITAMIN C) 1000 MG tablet    Oral   Take 1,000 mg by mouth daily.         . BusPIRone HCl (BUSPAR PO)   Oral   Take 1 tablet by mouth daily.         . Loratadine 10 MG CAPS   Oral   Take 1 capsule by mouth at bedtime. allergies         . Multiple Vitamins-Minerals (MULTIVITAMIN WITH MINERALS) tablet   Oral   Take 1 tablet by mouth daily.          BP 111/71  Pulse 69  Temp(Src) 98.8 F (37.1 C) (Oral)  Resp 18  Ht 5\' 9"  (1.753 m)  Wt 138 lb (62.596 kg)  BMI 20.37 kg/m2  SpO2 97%  LMP 07/24/2013 Physical Exam  Nursing note and vitals reviewed. Constitutional: She is oriented to person, place, and time. She appears well-developed and well-nourished.  HENT:  Head: Normocephalic.  Eyes: Pupils are equal, round, and reactive to light.  Neck: Normal range of motion.  Cardiovascular: Normal rate and regular rhythm.   Pulmonary/Chest: Effort normal.  Abdominal: Soft. Bowel sounds are normal. She exhibits no distension. There is no hepatosplenomegaly. There is tenderness in the right lower quadrant. There is no guarding and no CVA tenderness.  Genitourinary: Uterus normal. Uterus is not tender. Right adnexum displays tenderness. Right adnexum displays no fullness. There is bleeding around the vagina. No vaginal discharge found.  Musculoskeletal: Normal range of motion.  Neurological: She is alert and oriented to person, place, and time.  Skin: Skin is warm and dry.    ED Course  Procedures (including critical care time) Labs Review Labs Reviewed  URINALYSIS, ROUTINE W REFLEX MICROSCOPIC - Abnormal; Notable for the following:    APPearance CLOUDY (*)    All other components within normal limits  GC/CHLAMYDIA PROBE AMP  WET PREP, GENITAL  CBC  POCT PREGNANCY, URINE   Imaging Review [place] Transvaginal Non-ob  07/31/2013   CLINICAL DATA:  38 year old female with heavy bleeding, passing clots. History of left oophorectomy. Initial encounter.  EXAM: TRANSABDOMINAL AND TRANSVAGINAL ULTRASOUND OF  PELVIS  DOPPLER ULTRASOUND OF OVARIES  TECHNIQUE: Both transabdominal and transvaginal ultrasound examinations of the pelvis were performed. Transabdominal technique was performed for global imaging of the pelvis including uterus, ovaries, adnexal regions, and pelvic cul-de-sac.  It was necessary to proceed with endovaginal exam following the transabdominal exam to visualize the ovary. Color and duplex Doppler ultrasound was utilized to evaluate blood flow to the ovaries.  COMPARISON:  Pelvis ultrasound 07/24/2011.  FINDINGS: Uterus  Measurements: 9.9 x 5.2 x 6.8 cm. No fibroids or other mass visualized. Chronic nabothian type cyst with increased through transmission lower uterine segment. Today this measures up to 12 mm diameter (5 mm in 2013).  Endometrium  Thickness: 5 mm.  No focal abnormality visualized.  Right ovary  Measurements: 4.8 x 2.6 x 2.7 cm. Multiple follicles. Normal appearance/no adnexal mass.  Left ovary  Measurements: Surgically  absent.  Pulsed Doppler evaluation of the right ovary demonstrates normal low-resistance arterial and venous waveforms.  Other findings  Trace simple appearing free fluid.  IMPRESSION: Negative ultrasound appearance of the pelvis status post left oophorectomy. No evidence of right ovarian torsion.   Electronically Signed   By: Augusto Gamble M.D.   On: 07/31/2013 00:43   US Pelvis Complete  07/31/2013   CLINICAL DATA:  38 year old female with heavy bleeding, passing clots. History of left oophorectomy. Initial encounter.  EXAM: TRANSABDOMINAL AND TRANSVAGINAL ULTRASOUND OF PELVIS  DOPPLER ULTRASOUND OF OVARIES  TECHNIQUE: Both transabdominal and transvaginal ultrasound examinations of the pelvis were performed. Transabdominal technique was performed for global imaging of the pelvis including uterus, ovaries, adnexal regions, and pelvic cul-de-sac.  It was necessary to proceed with endovaginal exam following the transabdominal exam to visualize the ovary. Color and duplex  Doppler ultrasound was utilized to evaluate blood flow to the ovaries.  COMPARISON:  Pelvis ultrasound 07/24/2011.  FINDINGS: Uterus  Measurements: 9.9 x 5.2 x 6.8 cm. No fibroids or other mass visualized. Chronic nabothian type cyst with increased through transmission lower uterine segment. Today this measures up to 12 mm diameter (5 mm in 2013).  Endometrium  Thickness: 5 mm.  No focal abnormality visualized.  Right ovary  Measurements: 4.8 x 2.6 x 2.7 cm. Multiple follicles. Normal appearance/no adnexal mass.  Left ovary  Measurements: Surgically absent.  Pulsed Doppler evaluation of the right ovary demonstrates normal low-resistance arterial and venous waveforms.  Other findings  Trace simple appearing free fluid.  IMPRESSION: Negative ultrasound appearance of the pelvis status post left oophorectomy. No evidence of right ovarian torsion.   Electronically Signed   By: Augusto Gamble M.D.   On: 07/31/2013 00:43   [place] Art/ven Flow Abd Pelv Doppler  07/31/2013   CLINICAL DATA:  39 year old female with heavy bleeding, passing clots. History of left oophorectomy. Initial encounter.  EXAM: TRANSABDOMINAL AND TRANSVAGINAL ULTRASOUND OF PELVIS  DOPPLER ULTRASOUND OF OVARIES  TECHNIQUE: Both transabdominal and transvaginal ultrasound examinations of the pelvis were performed. Transabdominal technique was performed for global imaging of the pelvis including uterus, ovaries, adnexal regions, and pelvic cul-de-sac.  It was necessary to proceed with endovaginal exam following the transabdominal exam to visualize the ovary. Color and duplex Doppler ultrasound was utilized to evaluate blood flow to the ovaries.  COMPARISON:  Pelvis ultrasound 07/24/2011.  FINDINGS: Uterus  Measurements: 9.9 x 5.2 x 6.8 cm. No fibroids or other mass visualized. Chronic nabothian type cyst with increased through transmission lower uterine segment. Today this measures up to 12 mm diameter (5 mm in 2013).  Endometrium  Thickness: 5 mm.  No focal  abnormality visualized.  Right ovary  Measurements: 4.8 x 2.6 x 2.7 cm. Multiple follicles. Normal appearance/no adnexal mass.  Left ovary  Measurements: Surgically absent.  Pulsed Doppler evaluation of the right ovary demonstrates normal low-resistance arterial and venous waveforms.  Other findings  Trace simple appearing free fluid.  IMPRESSION: Negative ultrasound appearance of the pelvis status post left oophorectomy. No evidence of right ovarian torsion.   Electronically Signed   By: Augusto Gamble M.D.   On: 07/31/2013 00:43    EKG Interpretation   None       MDM   Final diagnoses:  Menorrhagia     Patient's labs are within normal limits limits.  She is not anemic.  Her pelvic ultrasound reveals that she has a normal right ovary.  Minimal endometrial thickness to the uterine wall.  Bleeding has substantially, subsided.  She's been able to stand and walk without passing a large clot.  I feel she is safe to go home and followup with her primary care physician or her OB/GYN    Arman Filter, NP 07/31/13 0148

## 2013-07-30 NOTE — ED Notes (Signed)
Pelvic cart set up in room. Patient undressed.

## 2013-07-31 LAB — WET PREP, GENITAL
Clue Cells Wet Prep HPF POC: NONE SEEN
Trich, Wet Prep: NONE SEEN
Yeast Wet Prep HPF POC: NONE SEEN

## 2013-07-31 NOTE — ED Notes (Addendum)
Pt ambulated to restroom without difficulty. No outpour of blood per pt.

## 2013-07-31 NOTE — Discharge Instructions (Signed)
Ultrasound revealed normal ovary.  No swelling, or inflammation of the uterine lining, your labs are all within normal limits.  Please followup with your OB/GYN or your primary care physician

## 2013-07-31 NOTE — ED Notes (Signed)
Pt denies any pain and pad that was placed 30 minutes prior has no blood present.

## 2013-07-31 NOTE — ED Provider Notes (Signed)
Medical screening examination/treatment/procedure(s) were performed by non-physician practitioner and as supervising physician I was immediately available for consultation/collaboration.  EKG Interpretation   None         Gwyneth SproutWhitney Clydell Sposito, MD 07/31/13 1237

## 2013-08-01 LAB — GC/CHLAMYDIA PROBE AMP
CT Probe RNA: NEGATIVE
GC Probe RNA: NEGATIVE

## 2014-04-17 ENCOUNTER — Encounter (HOSPITAL_COMMUNITY): Payer: Self-pay | Admitting: Emergency Medicine
